# Patient Record
Sex: Female | Born: 1995 | Race: Black or African American | Hispanic: No | Marital: Single | Smoking: Current every day smoker
Health system: Southern US, Community
[De-identification: ages and names within clinical notes are randomized; demographics above are authoritative.]

---
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
## 2019-04-24 ENCOUNTER — Other Ambulatory Visit: Payer: Self-pay

## 2019-04-24 ENCOUNTER — Encounter (HOSPITAL_COMMUNITY): Payer: Self-pay | Admitting: *Deleted

## 2019-04-24 ENCOUNTER — Emergency Department (HOSPITAL_COMMUNITY)
Admission: EM | Admit: 2019-04-24 | Discharge: 2019-04-25 | Discharge status: Against Medical Advice | Payer: Managed Care, Other (non HMO) | Attending: Emergency Medicine | Admitting: Emergency Medicine

## 2019-04-24 DIAGNOSIS — R110 Nausea: Secondary | ICD-10-CM | POA: Insufficient documentation

## 2019-04-24 DIAGNOSIS — R51 Headache: Secondary | ICD-10-CM | POA: Insufficient documentation

## 2019-04-24 DIAGNOSIS — Z5321 Procedure and treatment not carried out due to patient leaving prior to being seen by health care provider: Secondary | ICD-10-CM | POA: Insufficient documentation

## 2019-04-24 NOTE — ED Triage Notes (Signed)
Pt reports fatigue, headaches and nausea. requesting pregnancy test. lmp mid July. No distress noted at triage.

## 2019-04-25 LAB — POC URINE PREG, ED: Preg Test, Ur: NEGATIVE

## 2019-08-05 ENCOUNTER — Emergency Department (HOSPITAL_COMMUNITY)
Admission: EM | Admit: 2019-08-05 | Discharge: 2019-08-05 | Disposition: A | Discharge status: Home / Self Care | Payer: Self-pay | Attending: Emergency Medicine | Admitting: Emergency Medicine

## 2019-08-05 ENCOUNTER — Other Ambulatory Visit: Payer: Self-pay

## 2019-08-05 DIAGNOSIS — B9689 Other specified bacterial agents as the cause of diseases classified elsewhere: Secondary | ICD-10-CM | POA: Insufficient documentation

## 2019-08-05 DIAGNOSIS — F17210 Nicotine dependence, cigarettes, uncomplicated: Secondary | ICD-10-CM | POA: Insufficient documentation

## 2019-08-05 DIAGNOSIS — E669 Obesity, unspecified: Secondary | ICD-10-CM | POA: Insufficient documentation

## 2019-08-05 DIAGNOSIS — N760 Acute vaginitis: Secondary | ICD-10-CM | POA: Insufficient documentation

## 2019-08-05 DIAGNOSIS — Z6830 Body mass index (BMI) 30.0-30.9, adult: Secondary | ICD-10-CM | POA: Insufficient documentation

## 2019-08-05 LAB — WET PREP, GENITAL
Sperm: NONE SEEN
Trich, Wet Prep: NONE SEEN
Yeast Wet Prep HPF POC: NONE SEEN

## 2019-08-05 LAB — HIV ANTIBODY (ROUTINE TESTING W REFLEX): HIV Screen 4th Generation wRfx: NONREACTIVE

## 2019-08-05 LAB — POC URINE PREG, ED: Preg Test, Ur: NEGATIVE

## 2019-08-05 MED ORDER — METRONIDAZOLE 500 MG PO TABS: 500.0000 mg | ORAL_TABLET | Freq: Two times a day (BID) | ORAL | 0 refills | Status: DC

## 2019-08-05 NOTE — ED Notes (Signed)
Patient verbalizes understanding of discharge instructions. Opportunity for questioning and answers were provided. Armband removed by staff, pt discharged from ED ambulatory to home.  

## 2019-08-05 NOTE — Discharge Instructions (Signed)
Please read the instructions below.  Talk with your primary care provider about any new medications.  Please schedule an appointment for follow up with your OBGYN or primary care.  Finish your antibiotic (Flagyl/Metronidazole) as prescribed. Do not drink alcohol with this medication as it will cause vomiting.  You will receive a call from the hospital if your test results come back positive. Avoid all sexual activity until you know your test results. If your results come back positive, it is important that you inform all of your sexual partners.  Return to the ER for high fever, severe abdominal pain, or new or worsening symptoms.   

## 2019-08-05 NOTE — ED Provider Notes (Signed)
[facility] EMERGENCY DEPARTMENT Provider Note   CSN: [id number] Arrival date & time: 08/05/19  1009     History   Chief Complaint Chief Complaint  Patient presents with  . Vaginal Discharge    HPI Cassandra Gray is a 23 y.o. female without significant past medical history, presenting to the emergency department with complaint of intermittent white vaginal discharge over the last month.  She states she has never had a pelvic exam or Pap smear, and wanted to be sure that her discharge was not abnormal.  She is sexually active with female partners without protection.  No OTC medications tried for symptoms.  Denies fever, abdominal pain, dysuria, or other associated symptoms.  LMP 07/25/2019.     The history is provided by the patient.    No past medical history on file.  There are no active problems to display for this patient.   No past surgical history on file.   OB History   No obstetric history on file.      Home Medications    Prior to Admission medications   Medication Sig Start Date End Date Taking? Authorizing Provider  metroNIDAZOLE (FLAGYL) 500 MG tablet Take 1 tablet (500 mg total) by mouth 2 (two) times daily. 08/05/19   , [place] N, PA-C    Family History No family history on file.  Social History Social History   Tobacco Use  . Smoking status: Current Every Day Smoker  Substance Use Topics  . Alcohol use: Not on file  . Drug use: Not on file     Allergies   Tomato   Review of Systems Review of Systems  Constitutional: Negative for fever.  Gastrointestinal: Negative for abdominal pain.  Genitourinary: Positive for vaginal discharge. Negative for dysuria, frequency and pelvic pain.  All other systems reviewed and are negative.    Physical Exam Updated Vital Signs BP 131/89 (BP Location: Right Arm)   Pulse 74   Temp 98.6 F (37 C) (Oral)   Resp 16   Ht 5\' 1"  (1.549 m)   Wt 72.6 kg   LMP 07/25/2019   SpO2  100%   BMI 30.23 kg/m   Physical Exam Vitals signs and nursing note reviewed. Exam conducted with a chaperone present.  Constitutional:      General: She is not in acute distress.    Appearance: She is well-developed. She is obese. She is not ill-appearing.  HENT:     Head: Normocephalic and atraumatic.  Eyes:     Conjunctiva/sclera: Conjunctivae normal.  Cardiovascular:     Rate and Rhythm: Normal rate and regular rhythm.  Pulmonary:     Effort: Pulmonary effort is normal. No respiratory distress.     Breath sounds: Normal breath sounds.  Abdominal:     General: Bowel sounds are normal.     Palpations: Abdomen is soft.     Tenderness: There is no abdominal tenderness.  Genitourinary:    General: Normal vulva.     Labia:        Right: No rash or tenderness.        Left: No rash or tenderness.      Comments: Exam performed with female RN chaperone present.  Small to moderate amount of white discharge present.  Cervix is slightly friable.  No tenderness on bimanual exam. Skin:    General: Skin is warm.  Neurological:     Mental Status: She is alert.  Psychiatric:  Behavior: Behavior normal.      ED Treatments / Results  Labs (all labs ordered are listed, but only abnormal results are displayed) Labs Reviewed  WET PREP, GENITAL - Abnormal; Notable for the following components:      Result Value   Clue Cells Wet Prep HPF POC PRESENT (*)    WBC, Wet Prep HPF POC MANY (*)    All other components within normal limits  HIV ANTIBODY (ROUTINE TESTING W REFLEX)  RPR  POC URINE PREG, ED  GC/CHLAMYDIA PROBE AMP ([place]) NOT AT [facility]    EKG None  Radiology No results found.  Procedures Procedures (including critical care time)  Medications Ordered in ED Medications - No data to display   Initial Impression / Assessment and Plan / ED Course  I have reviewed the triage vital signs and the nursing notes.  Pertinent labs & imaging results that were  available during my care of the patient were reviewed by me and considered in my medical decision making (see chart for details).        Pt presenting vaginal discharge. Exam is not concerning for PID because pt is hemodynamically stable, and there is no CMT or adnexal tenderness on pelvic exam. Wet prep with clue cells and many WBC. Pt will be treated with flagyl for Bacterial Vaginosis. Pt has been advised to not drink alcohol while on this medication.Pt is aware she has STD cultures pending and instructed to avoid intercourse until she knows her results and to inform all partners of any positive results. Pt instructed to follow up with her GYN/PCP regarding today's visit. Pt is in no distress, safe for discharge.  Final Clinical Impressions(s) / ED Diagnoses   Final diagnoses:  Bacterial vaginosis    ED Discharge Orders         Ordered    metroNIDAZOLE (FLAGYL) 500 MG tablet  2 times daily     08/05/19 1454           , [place] N, [place] 08/05/19 1500    Sabas Sous, MD 08/06/19 [phone]

## 2019-08-05 NOTE — ED Triage Notes (Signed)
Pt reports occasional vaginal discharge. No OB/gyn doctor. VSS,

## 2019-08-06 LAB — RPR: RPR Ser Ql: NONREACTIVE

## 2020-05-02 ENCOUNTER — Ambulatory Visit: Payer: Self-pay | Attending: Internal Medicine

## 2020-05-02 DIAGNOSIS — Z23 Encounter for immunization: Secondary | ICD-10-CM

## 2020-05-02 NOTE — Progress Notes (Signed)
   Covid-19 Vaccination Clinic  Name:  Cassandra Gray    MRN: [id number] DOB: Jul 14, 1996  05/02/2020  [name] was observed post Covid-19 immunization for 15 minutes without incident. She was provided with Vaccine Information Sheet and instruction to access the V-Safe system.   [name] was instructed to call 911 with any severe reactions post vaccine: Marland Kitchen Difficulty breathing  . Swelling of face and throat  . A fast heartbeat  . A bad rash all over body  . Dizziness and weakness   Immunizations Administered    Name Date Dose VIS Date Route   Pfizer COVID-19 Vaccine 05/02/2020 10:38 AM 0.3 mL 11/10/2018 Intramuscular   Manufacturer: [employer], [employer]   Lot: [id number]   NDC: [address]-3

## 2022-10-19 ENCOUNTER — Ambulatory Visit
Admission: EM | Admit: 2022-10-19 | Discharge: 2022-10-19 | Disposition: A | Discharge status: Home / Self Care | Payer: Self-pay | Attending: Physician Assistant | Admitting: Physician Assistant

## 2022-10-19 DIAGNOSIS — Z113 Encounter for screening for infections with a predominantly sexual mode of transmission: Secondary | ICD-10-CM | POA: Insufficient documentation

## 2022-10-19 DIAGNOSIS — N939 Abnormal uterine and vaginal bleeding, unspecified: Secondary | ICD-10-CM | POA: Insufficient documentation

## 2022-10-19 LAB — POCT URINE PREGNANCY: Preg Test, Ur: NEGATIVE

## 2022-10-19 NOTE — ED Provider Notes (Signed)
[facility]    CSN: [id number] Arrival date & time: 10/19/22  6269      History   Chief Complaint Chief Complaint  Patient presents with   Vaginal Bleeding    HPI Cassandra Gray is a 27 y.o. female.   Patient here today for evaluation of abnormal vaginal bleeding.  She states that she has had 2 periods this month within 3 weeks which is abnormal for her.  She has not had any vaginal discharge, genital lesions or other concerns.  She does report some back pain at times.  She would like STD screening while she is here but denies any known exposures.  The history is provided by the patient.  Vaginal Bleeding Associated symptoms: back pain   Associated symptoms: no fever, no nausea and no vaginal discharge     History reviewed. No pertinent past medical history.  There are no problems to display for this patient.   History reviewed. No pertinent surgical history.  OB History   No obstetric history on file.      Home Medications    Prior to Admission medications   Medication Sig Start Date End Date Taking? Authorizing Provider  metroNIDAZOLE (FLAGYL) 500 MG tablet Take 1 tablet (500 mg total) by mouth 2 (two) times daily. 08/05/19   Robinson, [place] N, PA-C    Family History Family History  Family history unknown: Yes    Social History Social History   Tobacco Use   Smoking status: Every Day     Allergies   Tomato   Review of Systems Review of Systems  Constitutional:  Negative for chills and fever.  Eyes:  Negative for discharge and redness.  Gastrointestinal:  Negative for nausea and vomiting.  Genitourinary:  Positive for vaginal bleeding. Negative for genital sores and vaginal discharge.  Musculoskeletal:  Positive for back pain.     Physical Exam Triage Vital Signs ED Triage Vitals  Enc Vitals Group     BP 10/19/22 0847 130/82     Pulse Rate 10/19/22 0847 83     Resp 10/19/22 0847 18     Temp 10/19/22 0847 97.9 F (36.6  C)     Temp Source 10/19/22 0847 Oral     SpO2 10/19/22 0847 98 %     Weight --      Height --      Head Circumference --      Peak Flow --      Pain Score 10/19/22 0849 0     Pain Loc --      Pain Edu? --      Excl. in [place]? --    No data found.  Updated Vital Signs BP 130/82 (BP Location: Left Arm)   Pulse 83   Temp 97.9 F (36.6 C) (Oral)   Resp 18   LMP 10/17/2022 Comment: 2 periods within 30 day cycle  SpO2 98%     Physical Exam Vitals and nursing note reviewed.  Constitutional:      General: She is not in acute distress.    Appearance: Normal appearance. She is not ill-appearing.  HENT:     Head: Normocephalic and atraumatic.  Eyes:     Conjunctiva/sclera: Conjunctivae normal.  Cardiovascular:     Rate and Rhythm: Normal rate.  Pulmonary:     Effort: Pulmonary effort is normal. No respiratory distress.  Neurological:     Mental Status: She is alert.  Psychiatric:        Mood  and Affect: Mood normal.        Behavior: Behavior normal.        Thought Content: Thought content normal.      UC Treatments / Results  Labs (all labs ordered are listed, but only abnormal results are displayed) Labs Reviewed  CBC WITH DIFFERENTIAL/PLATELET  RPR  HIV ANTIBODY (ROUTINE TESTING W REFLEX)  HEPATITIS PANEL, ACUTE  POCT URINE PREGNANCY  CERVICOVAGINAL ANCILLARY ONLY    EKG   Radiology No results found.  Procedures Procedures (including critical care time)  Medications Ordered in UC Medications - No data to display  Initial Impression / Assessment and Plan / UC Course  I have reviewed the triage vital signs and the nursing notes.  Pertinent labs & imaging results that were available during my care of the patient were reviewed by me and considered in my medical decision making (see chart for details).    Urine pregnancy negative in office.  Unknown cause of recurrent vaginal bleeding but recommended further evaluation in the emergency department if  symptoms worsen in any way.  STD screening ordered as well as CBC to monitor hemoglobin.  Encouraged patient to follow-up with any further concerns.  Final Clinical Impressions(s) / UC Diagnoses   Final diagnoses:  Screening for STD (sexually transmitted disease)  Abnormal uterine bleeding (AUB)   Discharge Instructions   None    ED Prescriptions   None    PDMP not reviewed this encounter.   Francene Finders, PA-C 10/19/22 1144

## 2022-10-19 NOTE — ED Triage Notes (Signed)
Pt presents with abnormal vaginal bleeding within a 30 day period; pt states she has had 2 periods this month within 3 weeks.

## 2022-10-21 ENCOUNTER — Telehealth: Payer: Self-pay

## 2022-10-21 LAB — CBC WITH DIFFERENTIAL/PLATELET
Basophils Absolute: 0 10*3/uL (ref 0.0–0.2)
Basos: 1 %
EOS (ABSOLUTE): 0.2 10*3/uL (ref 0.0–0.4)
Eos: 3 %
Hematocrit: 42.6 % (ref 34.0–46.6)
Hemoglobin: 14.4 g/dL (ref 11.1–15.9)
Immature Grans (Abs): 0 10*3/uL (ref 0.0–0.1)
Immature Granulocytes: 0 %
Lymphocytes Absolute: 3 10*3/uL (ref 0.7–3.1)
Lymphs: 43 %
MCH: 29.4 pg (ref 26.6–33.0)
MCHC: 33.8 g/dL (ref 31.5–35.7)
MCV: 87 fL (ref 79–97)
Monocytes Absolute: 0.6 10*3/uL (ref 0.1–0.9)
Monocytes: 8 %
Neutrophils Absolute: 3.2 10*3/uL (ref 1.4–7.0)
Neutrophils: 45 %
Platelets: 240 10*3/uL (ref 150–450)
RBC: 4.89 x10E6/uL (ref 3.77–5.28)
RDW: 12.7 % (ref 11.7–15.4)
WBC: 7 10*3/uL (ref 3.4–10.8)

## 2022-10-21 LAB — CERVICOVAGINAL ANCILLARY ONLY
Bacterial Vaginitis (gardnerella): NEGATIVE
Candida Glabrata: NEGATIVE
Candida Vaginitis: POSITIVE — AB
Chlamydia: NEGATIVE
Comment: NEGATIVE
Comment: NEGATIVE
Comment: NEGATIVE
Comment: NEGATIVE
Comment: NEGATIVE
Comment: NORMAL
Neisseria Gonorrhea: NEGATIVE
Trichomonas: NEGATIVE

## 2022-10-21 LAB — HIV ANTIBODY (ROUTINE TESTING W REFLEX): HIV Screen 4th Generation wRfx: NONREACTIVE

## 2022-10-21 LAB — RPR: RPR Ser Ql: NONREACTIVE

## 2022-10-21 MED ORDER — FLUCONAZOLE 150 MG PO TABS: 150.0000 mg | ORAL_TABLET | Freq: Once | ORAL | 0 refills | Status: AC

## 2022-10-21 NOTE — Telephone Encounter (Signed)
Rx for vaginal yeast infection sent to pharmacy of choice per standard work. Education provided.

## 2022-11-09 ENCOUNTER — Telehealth: Payer: Self-pay | Admitting: Physician Assistant

## 2022-11-09 DIAGNOSIS — R21 Rash and other nonspecific skin eruption: Secondary | ICD-10-CM

## 2022-11-09 MED ORDER — MUPIROCIN CALCIUM 2 % EX CREA: TOPICAL_CREAM | Freq: Two times a day (BID) | CUTANEOUS | Status: DC

## 2022-11-09 NOTE — Progress Notes (Signed)
Virtual Visit Consent   RHEN MARCK, you are scheduled for a virtual visit with a [facility] [place] provider today. Just as with appointments in the office, your consent must be obtained to participate. Your consent will be active for this visit and any virtual visit you may have with one of our providers in the next 365 days. If you have a MyChart account, a copy of this consent can be sent to you electronically.  As this is a virtual visit, video technology does not allow for your provider to perform a traditional examination. This may limit your provider's ability to fully assess your condition. If your provider identifies any concerns that need to be evaluated in person or the need to arrange testing (such as labs, EKG, etc.), we will make arrangements to do so. Although advances in technology are sophisticated, we cannot ensure that it will always work on either your end or our end. If the connection with a video visit is poor, the visit may have to be switched to a telephone visit. With either a video or telephone visit, we are not always able to ensure that we have a secure connection.  By engaging in this virtual visit, you consent to the provision of healthcare and authorize for your insurance to be billed (if applicable) for the services provided during this visit. Depending on your insurance coverage, you may receive a charge related to this service.  I need to obtain your verbal consent now. Are you willing to proceed with your visit today? Cassandra Gray has provided verbal consent on 11/09/2022 for a virtual visit (video or telephone). Lenise Arena Ward, PA-C  Date: 11/09/2022 12:01 PM  Virtual Visit via Video Note   I, Lenise Arena Ward, connected with  Cassandra Gray  (LZ:[id number], 01/25/1996) on 11/09/22 at 12:00 PM EST by a video-enabled telemedicine application and verified that I am speaking with the correct person using two identifiers.  Location: Patient: Virtual Visit Location  Patient: Home Provider: Virtual Visit Location Provider: Home Office   I discussed the limitations of evaluation and management by telemedicine and the availability of in person appointments. The patient expressed understanding and agreed to proceed.    History of Present Illness: Cassandra Gray is a 27 y.o. who identifies as a female who was assigned female at birth, and is being seen today for a lesion on her upper lip that started about one week ago.  She denies pain, itching,  burning.  She has applied hydrogen peroxide.  She has applied carmex and aquaphor with minimal relief. Denies smoking.  Denies lesion in mouth.  She does not have a PCP.    HPI: HPI  Problems: There are no problems to display for this patient.   Allergies:  Allergies  Allergen Reactions   Tomato Hives   Medications:  Current Outpatient Medications:    metroNIDAZOLE (FLAGYL) 500 MG tablet, Take 1 tablet (500 mg total) by mouth 2 (two) times daily., Disp: 14 tablet, Rfl: 0  Current Facility-Administered Medications:    mupirocin cream (BACTROBAN) 2 %, , Topical, BID, Ward, Lenise Arena, PA-C  Observations/Objective: Patient is well-developed, well-nourished in no acute distress.  Resting comfortably at home.  Head is normocephalic, atraumatic.  No labored breathing.  Speech is clear and coherent with logical content.  Patient is alert and oriented at baseline.    Assessment and Plan: 1. Rash - mupirocin cream (BACTROBAN) 2 %  Rash is not consistent with herpes, advised pt to  discontinue hydrogen peroxide and apply antibiotic ointment and aquaphor.  In person evaluation recommended if no relief.   Follow Up Instructions: I discussed the assessment and treatment plan with the patient. The patient was provided an opportunity to ask questions and all were answered. The patient agreed with the plan and demonstrated an understanding of the instructions.  A copy of instructions were sent to the patient via  MyChart unless otherwise noted below.     The patient was advised to call back or seek an in-person evaluation if the symptoms worsen or if the condition fails to improve as anticipated.  Time:  I spent 6 minutes with the patient via telehealth technology discussing the above problems/concerns.    Lenise Arena Ward, PA-C

## 2022-11-09 NOTE — Patient Instructions (Signed)
  Cassandra Gray, thank you for joining Lenise Arena Ward, PA-C for today's virtual visit.  While this provider is not your primary care provider (PCP), if your PCP is located in our provider database this encounter information will be shared with them immediately following your visit.   [facility] account gives you access to today's visit and all your visits, tests, and labs performed at [facility] " click here if you don't have a [facility] account or go to [email or web address]  Consent: (Patient) Cassandra Gray provided verbal consent for this virtual visit at the beginning of the encounter.  Current Medications:  Current Outpatient Medications:    metroNIDAZOLE (FLAGYL) 500 MG tablet, Take 1 tablet (500 mg total) by mouth 2 (two) times daily., Disp: 14 tablet, Rfl: 0  Current Facility-Administered Medications:    mupirocin cream (BACTROBAN) 2 %, , Topical, BID, Ward, Lenise Arena, PA-C   Medications ordered in this encounter:  Meds ordered this encounter  Medications   mupirocin cream (BACTROBAN) 2 %     *If you need refills on other medications prior to your next appointment, please contact your pharmacy*  Follow-Up: Call back or seek an in-person evaluation if the symptoms worsen or if the condition fails to improve as anticipated.  [place] [phone]  Other Instructions Apply antibiotic ointment, use aquaphor.  If no improvement follow up for in person evaluation.    If you have been instructed to have an in-person evaluation today at a local Urgent Care facility, please use the link below. It will take you to a list of all of our available [facility], including address, phone number and hours of operation. Please do not delay care.  [facility]  If you or a family member do not have a primary care provider, use the link below to schedule a visit and establish care. When you choose a [facility] primary care physician or advanced practice provider, you gain a long-term partner in health. Find a Primary Care Provider  Learn more about 's in-office and virtual care options: [facility]

## 2022-11-10 ENCOUNTER — Telehealth: Payer: Self-pay | Admitting: Physician Assistant

## 2022-11-10 ENCOUNTER — Telehealth: Payer: Self-pay

## 2022-11-10 DIAGNOSIS — R21 Rash and other nonspecific skin eruption: Secondary | ICD-10-CM

## 2022-11-10 MED ORDER — MUPIROCIN 2 % EX OINT: 1.0000 | TOPICAL_OINTMENT | Freq: Two times a day (BID) | CUTANEOUS | 0 refills | Status: AC

## 2022-11-10 MED ORDER — MUPIROCIN 2 % EX OINT: TOPICAL_OINTMENT | Freq: Two times a day (BID) | CUTANEOUS | Status: DC

## 2022-11-10 NOTE — Addendum Note (Signed)
Addended by: Lyndon Code Z on: 11/10/2022 10:20 AM   Modules accepted: Orders

## 2022-11-10 NOTE — Progress Notes (Signed)
Pt seen yesterday for VV, mupiricin cream sent to pharmacy, pt reports unable to pick up.  Will send in the ointment to see if that is covered.   Konrad Felix Ward, PA-C

## 2023-02-28 ENCOUNTER — Ambulatory Visit
Admission: EM | Admit: 2023-02-28 | Discharge: 2023-02-28 | Disposition: A | Discharge status: Home / Self Care | Payer: Self-pay

## 2023-02-28 DIAGNOSIS — Z113 Encounter for screening for infections with a predominantly sexual mode of transmission: Secondary | ICD-10-CM

## 2023-02-28 DIAGNOSIS — Z202 Contact with and (suspected) exposure to infections with a predominantly sexual mode of transmission: Secondary | ICD-10-CM

## 2023-02-28 MED ORDER — METRONIDAZOLE 500 MG PO TABS: 500.0000 mg | ORAL_TABLET | Freq: Two times a day (BID) | ORAL | 0 refills | Status: AC

## 2023-02-28 NOTE — ED Triage Notes (Signed)
Pt requested STD's test. Reports partner has Trichomonas.

## 2023-02-28 NOTE — Discharge Instructions (Signed)
Your vaginal swab is pending.  Will call if it is positive.  I have sent an antibiotic to treat trichomonas exposure.  Follow-up if any symptoms persist or worsen.

## 2023-02-28 NOTE — ED Provider Notes (Signed)
[facility]    CSN: [id number] Arrival date & time: 02/28/23  1119      History   Chief Complaint Chief Complaint  Patient presents with   Exposure to STD    HPI Cassandra Gray is a 27 y.o. female.   Patient presents today for STD testing.  Reports that her most recent female sexual partner told her that he tested positive for trichomonas.  Patient denies any current symptoms.  Last menstrual cycle completed a week ago.  Patient states that she had protected intercourse with this sexual partner.   Exposure to STD    History reviewed. No pertinent past medical history.  There are no problems to display for this patient.   History reviewed. No pertinent surgical history.  OB History   No obstetric history on file.      Home Medications    Prior to Admission medications   Medication Sig Start Date End Date Taking? Authorizing Provider  metroNIDAZOLE (FLAGYL) 500 MG tablet Take 1 tablet (500 mg total) by mouth 2 (two) times daily. 02/28/23  Yes Chaynce Schafer, Rolly Salter E, FNP  Multiple Vitamin (MULTIVITAMIN ADULT PO) Take by mouth.   Yes [provider]  mupirocin ointment (BACTROBAN) 2 % Apply 1 Application topically 2 (two) times daily. 11/10/22   Ward, Tylene Fantasia, PA-C    Family History Family History  Family history unknown: Yes    Social History Social History   Tobacco Use   Smoking status: Every Day  Vaping Use   Vaping Use: Never used     Allergies   Tomato   Review of Systems Review of Systems Per HPI  Physical Exam Triage Vital Signs ED Triage Vitals  Enc Vitals Group     BP 02/28/23 1158 113/76     Pulse Rate 02/28/23 1158 66     Resp 02/28/23 1158 14     Temp 02/28/23 1158 97.7 F (36.5 C)     Temp Source 02/28/23 1158 Oral     SpO2 02/28/23 1158 97 %     Weight --      Height --      Head Circumference --      Peak Flow --      Pain Score 02/28/23 1157 0     Pain Loc --      Pain Edu? --      Excl. in GC? --     No data found.  Updated Vital Signs BP 113/76 (BP Location: Right Arm)   Pulse 66   Temp 97.7 F (36.5 C) (Oral)   Resp 14   LMP  (Within Weeks) Comment: 1 week.  SpO2 97%   Visual Acuity Right Eye Distance:   Left Eye Distance:   Bilateral Distance:    Right Eye Near:   Left Eye Near:    Bilateral Near:     Physical Exam Constitutional:      General: She is not in acute distress.    Appearance: Normal appearance. She is not toxic-appearing or diaphoretic.  HENT:     Head: Normocephalic and atraumatic.  Eyes:     Extraocular Movements: Extraocular movements intact.     Conjunctiva/sclera: Conjunctivae normal.  Pulmonary:     Effort: Pulmonary effort is normal.  Genitourinary:    Comments: Deferred with shared decision making.  Self swab performed. Neurological:     General: No focal deficit present.     Mental Status: She is alert and oriented to person, place,  and time. Mental status is at baseline.  Psychiatric:        Mood and Affect: Mood normal.        Behavior: Behavior normal.        Thought Content: Thought content normal.        Judgment: Judgment normal.      UC Treatments / Results  Labs (all labs ordered are listed, but only abnormal results are displayed) Labs Reviewed  CERVICOVAGINAL ANCILLARY ONLY    EKG   Radiology No results found.  Procedures Procedures (including critical care time)  Medications Ordered in UC Medications - No data to display  Initial Impression / Assessment and Plan / UC Course  I have reviewed the triage vital signs and the nursing notes.  Pertinent labs & imaging results that were available during my care of the patient were reviewed by me and considered in my medical decision making (see chart for details).     Given exposure to trichomonas, will prophylactically treat with metronidazole antibiotic today.  Cervicovaginal swab pending.  Will await results for any further treatment.  Advised safe sex  practices and following up with any further concerns.  Patient verbalized understanding and was agreeable with plan. Final Clinical Impressions(s) / UC Diagnoses   Final diagnoses:  Screening examination for venereal disease  Trichomonas exposure     Discharge Instructions      Your vaginal swab is pending.  Will call if it is positive.  I have sent an antibiotic to treat trichomonas exposure.  Follow-up if any symptoms persist or worsen.    ED Prescriptions     Medication Sig Dispense Auth. Provider   metroNIDAZOLE (FLAGYL) 500 MG tablet Take 1 tablet (500 mg total) by mouth 2 (two) times daily. 14 tablet Patterson, Acie Fredrickson, [place]      PDMP not reviewed this encounter.   Gustavus Bryant, [place] 02/28/23 [phone]

## 2023-03-03 LAB — CERVICOVAGINAL ANCILLARY ONLY
Bacterial Vaginitis (gardnerella): NEGATIVE
Candida Glabrata: NEGATIVE
Candida Vaginitis: NEGATIVE
Chlamydia: NEGATIVE
Comment: NEGATIVE
Comment: NEGATIVE
Comment: NEGATIVE
Comment: NEGATIVE
Comment: NEGATIVE
Comment: NORMAL
Neisseria Gonorrhea: NEGATIVE
Trichomonas: NEGATIVE

## 2023-04-20 ENCOUNTER — Emergency Department (HOSPITAL_BASED_OUTPATIENT_CLINIC_OR_DEPARTMENT_OTHER)
Admission: EM | Admit: 2023-04-20 | Discharge: 2023-04-20 | Disposition: A | Discharge status: Home / Self Care | Payer: Self-pay | Attending: Emergency Medicine | Admitting: Emergency Medicine

## 2023-04-20 ENCOUNTER — Encounter (HOSPITAL_BASED_OUTPATIENT_CLINIC_OR_DEPARTMENT_OTHER): Payer: Self-pay

## 2023-04-20 DIAGNOSIS — R1012 Left upper quadrant pain: Secondary | ICD-10-CM | POA: Insufficient documentation

## 2023-04-20 DIAGNOSIS — R1013 Epigastric pain: Secondary | ICD-10-CM | POA: Insufficient documentation

## 2023-04-20 DIAGNOSIS — R110 Nausea: Secondary | ICD-10-CM | POA: Insufficient documentation

## 2023-04-20 LAB — URINALYSIS, ROUTINE W REFLEX MICROSCOPIC
Bacteria, UA: NONE SEEN
Bilirubin Urine: NEGATIVE
Glucose, UA: NEGATIVE mg/dL
Ketones, ur: NEGATIVE mg/dL
Nitrite: NEGATIVE
Protein, ur: 30 mg/dL — AB
RBC / HPF: >50 RBC/hpf (ref 0–5)
Specific Gravity, Urine: 1.028 (ref 1.005–1.030)
pH: 6 (ref 5.0–8.0)

## 2023-04-20 LAB — COMPREHENSIVE METABOLIC PANEL
ALT: 10 U/L (ref 0–44)
AST: 14 U/L — ABNORMAL LOW (ref 15–41)
Albumin: 4.1 g/dL (ref 3.5–5.0)
Alkaline Phosphatase: 59 U/L (ref 38–126)
Anion gap: 7 (ref 5–15)
BUN: 11 mg/dL (ref 6–20)
CO2: 25 mmol/L (ref 22–32)
Calcium: 9.3 mg/dL (ref 8.9–10.3)
Chloride: 102 mmol/L (ref 98–111)
Creatinine, Ser: 0.71 mg/dL (ref 0.44–1.00)
GFR, Estimated: >60 mL/min (ref >60)
Glucose, Bld: 109 mg/dL — ABNORMAL HIGH (ref 70–99)
Potassium: 3.6 mmol/L (ref 3.5–5.1)
Sodium: 134 mmol/L — ABNORMAL LOW (ref 135–145)
Total Bilirubin: 0.4 mg/dL (ref 0.3–1.2)
Total Protein: 7.2 g/dL (ref 6.5–8.1)

## 2023-04-20 LAB — CBC WITH DIFFERENTIAL/PLATELET
Abs Immature Granulocytes: 0.02 10*3/uL (ref 0.00–0.07)
Basophils Absolute: 0 10*3/uL (ref 0.0–0.1)
Basophils Relative: 1 %
Eosinophils Absolute: 0.2 10*3/uL (ref 0.0–0.5)
Eosinophils Relative: 3 %
HCT: 37.1 % (ref 36.0–46.0)
Hemoglobin: 13.3 g/dL (ref 12.0–15.0)
Immature Granulocytes: 0 %
Lymphocytes Relative: 50 %
Lymphs Abs: 3.9 10*3/uL (ref 0.7–4.0)
MCH: 30.2 pg (ref 26.0–34.0)
MCHC: 35.8 g/dL (ref 30.0–36.0)
MCV: 84.3 fL (ref 80.0–100.0)
Monocytes Absolute: 0.6 10*3/uL (ref 0.1–1.0)
Monocytes Relative: 7 %
Neutro Abs: 3.1 10*3/uL (ref 1.7–7.7)
Neutrophils Relative %: 39 %
Platelets: 212 10*3/uL (ref 150–400)
RBC: 4.4 MIL/uL (ref 3.87–5.11)
RDW: 12.3 % (ref 11.5–15.5)
WBC: 7.9 10*3/uL (ref 4.0–10.5)
nRBC: 0 % (ref 0.0–0.2)

## 2023-04-20 LAB — LIPASE, BLOOD: Lipase: 45 U/L (ref 11–51)

## 2023-04-20 LAB — PREGNANCY, URINE: Preg Test, Ur: NEGATIVE

## 2023-04-20 MED ORDER — ALUM & MAG HYDROXIDE-SIMETH 200-200-20 MG/5ML PO SUSP
30.0000 mL | Freq: Once | ORAL | Status: AC
  Administered 2023-04-20: 30 mL via ORAL
  Filled 2023-04-20: qty 30

## 2023-04-20 MED ORDER — HYOSCYAMINE SULFATE 0.125 MG SL SUBL
0.1250 mg | SUBLINGUAL_TABLET | Freq: Once | SUBLINGUAL | Status: AC
  Administered 2023-04-20: 0.125 mg via SUBLINGUAL
  Filled 2023-04-20: qty 1

## 2023-04-20 MED ORDER — LIDOCAINE VISCOUS HCL 2 % MT SOLN
15.0000 mL | Freq: Once | OROMUCOSAL | Status: AC
  Administered 2023-04-20: 15 mL via ORAL
  Filled 2023-04-20: qty 15

## 2023-04-20 MED ORDER — FAMOTIDINE 20 MG PO TABS: 20.0000 mg | ORAL_TABLET | Freq: Every day | ORAL | Status: AC

## 2023-04-20 NOTE — ED Provider Notes (Signed)
[place] EMERGENCY DEPARTMENT AT [facility] Provider Note  CSN: [id number] Arrival date & time: 04/20/23 3086  Chief Complaint(s) Abdominal Pain  HPI Cassandra Gray is a 27 y.o. female with no pertinent past medical history who presents to the emergency department with left-sided abdominal pain for several days.  Intermittent and fluctuating nature.  Worse with eating, almost immediately.  Subsides throughout the day.  Associated nausea without emesis.  No diarrhea.  No urinary symptoms.  Patient reports that she began her menstrual cycle yesterday.  Denies any vaginal discharge.  Reports that she had pizza earlier this evening and made the pain more severe. She has been taking Motrin for the pain.  The history is provided by the patient.    Past Medical History History reviewed. No pertinent past medical history. There are no problems to display for this patient.  Home Medication(s) Prior to Admission medications   Medication Sig Start Date End Date Taking? Authorizing Provider  famotidine (PEPCID) 20 MG tablet Take 1 tablet (20 mg total) by mouth daily. 04/20/23 05/20/23 Yes Safaa Stingley, Amadeo Garnet, MD  metroNIDAZOLE (FLAGYL) 500 MG tablet Take 1 tablet (500 mg total) by mouth 2 (two) times daily. 02/28/23   Gustavus Bryant, FNP  Multiple Vitamin (MULTIVITAMIN ADULT PO) Take by mouth.    [provider]  mupirocin ointment (BACTROBAN) 2 % Apply 1 Application topically 2 (two) times daily. 11/10/22   Ward, Tylene Fantasia, PA-C                                                                                                                                    Allergies Tomato  Review of Systems Review of Systems As noted in HPI  Physical Exam Vital Signs  I have reviewed the triage vital signs BP 115/73   Pulse 69   Temp 97.9 F (36.6 C) (Oral)   Resp 18   Ht 5\' 1"  (1.549 m)   Wt 76.2 kg   LMP 04/20/2023 (Exact Date)   SpO2 100%   BMI 31.74 kg/m   Physical  Exam Vitals reviewed.  Constitutional:      General: She is not in acute distress.    Appearance: She is well-developed. She is not diaphoretic.  HENT:     Head: Normocephalic and atraumatic.     Right Ear: External ear normal.     Left Ear: External ear normal.     Nose: Nose normal.  Eyes:     General: No scleral icterus.    Conjunctiva/sclera: Conjunctivae normal.  Neck:     Trachea: Phonation normal.  Cardiovascular:     Rate and Rhythm: Normal rate and regular rhythm.  Pulmonary:     Effort: Pulmonary effort is normal. No respiratory distress.     Breath sounds: No stridor.  Abdominal:     General: There is no distension.     Tenderness: There is abdominal tenderness (mild)  in the left upper quadrant. There is no guarding or rebound.  Musculoskeletal:        General: Normal range of motion.     Cervical back: Normal range of motion.  Neurological:     Mental Status: She is alert and oriented to person, place, and time.  Psychiatric:        Behavior: Behavior normal.     ED Results and Treatments Labs (all labs ordered are listed, but only abnormal results are displayed) Labs Reviewed  COMPREHENSIVE METABOLIC PANEL - Abnormal; Notable for the following components:      Result Value   Sodium 134 (*)    Glucose, Bld 109 (*)    AST 14 (*)    All other components within normal limits  URINALYSIS, ROUTINE W REFLEX MICROSCOPIC - Abnormal; Notable for the following components:   Color, Urine ORANGE (*)    APPearance HAZY (*)    Hgb urine dipstick LARGE (*)    Protein, ur 30 (*)    Leukocytes,Ua SMALL (*)    All other components within normal limits  LIPASE, BLOOD  CBC WITH DIFFERENTIAL/PLATELET  PREGNANCY, URINE                                                                                                                         EKG  EKG Interpretation Date/Time:    Ventricular Rate:    PR Interval:    QRS Duration:    QT Interval:    QTC Calculation:   R  Axis:      Text Interpretation:         Radiology No results found.  Medications Ordered in ED Medications  alum & mag hydroxide-simeth (MAALOX/MYLANTA) 200-200-20 MG/5ML suspension 30 mL (30 mLs Oral Given 04/20/23 0150)    And  lidocaine (XYLOCAINE) 2 % viscous mouth solution 15 mL (15 mLs Oral Given 04/20/23 0150)  hyoscyamine (LEVSIN SL) SL tablet 0.125 mg (0.125 mg Sublingual Given 04/20/23 0150)   Procedures Procedures  (including critical care time) Medical Decision Making / ED Course   Medical Decision Making Amount and/or Complexity of Data Reviewed Labs: ordered.  Risk OTC drugs. Prescription drug management.     Left upper quadrant abdominal pain Postprandial.  Favoring dyspepsia/gastritis/GERD. Will get labs to rule out pancreatitis, biliary disease. Unlikely pregnancy related process but will obtain UPT to guide care. Triage ordered UA.  CBC without leukocytosis or anemia Metabolic panel without significant electrolyte derangements or renal sufficiency No evidence of bili obstruction or pancreatitis UA contaminated blood likely from menstrual cycle.  Not overtly concerning for infection. UPT negative.  Low suspicion for serious intra-abdominal inflammatory/infectious process or bowel obstruction requiring imaging at this time  Patient given GI cocktail and reporting improved symptoms.  Tolerating p.o.    Final Clinical Impression(s) / ED Diagnoses Final diagnoses:  LUQ pain  Dyspepsia   The patient appears reasonably screened and/or stabilized for discharge and I doubt any other medical condition or other  EMC requiring further screening, evaluation, or treatment in the ED at this time. I have discussed the findings, Dx and Tx plan with the patient/family who expressed understanding and agree(s) with the plan. Discharge instructions discussed at length. The patient/family was given strict return precautions who verbalized understanding of the  instructions. No further questions at time of discharge.  Disposition: Discharge  Condition: Good  ED Discharge Orders          Ordered    famotidine (PEPCID) 20 MG tablet  Daily        04/20/23 0209            Follow Up: Primary care provider  Call  to schedule an appointment for close follow up    This chart was dictated using voice recognition software.  Despite best efforts to proofread,  errors can occur which can change the documentation meaning.    Nira Conn, MD 04/20/23 [phone]

## 2023-04-20 NOTE — ED Triage Notes (Signed)
Pt reports severe gen abd pain worse on left side, "feels like knots". Worsens after eating. Endorses nausea, denies vomiting or diarrhea
